# Patient Record
Sex: Male | Born: 1955 | Race: White | Hispanic: No | Marital: Married | Smoking: Current every day smoker
Health system: Southern US, Community
[De-identification: ages and names within clinical notes are randomized; demographics above are authoritative.]

## PROBLEM LIST (undated history)

## (undated) DIAGNOSIS — E785 Hyperlipidemia, unspecified: Secondary | ICD-10-CM

## (undated) DIAGNOSIS — M1990 Unspecified osteoarthritis, unspecified site: Secondary | ICD-10-CM

## (undated) DIAGNOSIS — R52 Pain, unspecified: Secondary | ICD-10-CM

## (undated) HISTORY — PX: CHOLECYSTECTOMY: SHX55

## (undated) HISTORY — PX: PROSTATE SURGERY: SHX751

## (undated) HISTORY — PX: TOTAL SHOULDER REPLACEMENT: SUR1217

---

## 2004-06-05 ENCOUNTER — Emergency Department: Payer: Self-pay | Admitting: Emergency Medicine

## 2004-09-20 ENCOUNTER — Emergency Department: Payer: Self-pay | Admitting: Emergency Medicine

## 2004-10-20 ENCOUNTER — Emergency Department: Payer: Self-pay | Admitting: Emergency Medicine

## 2005-10-03 ENCOUNTER — Emergency Department: Payer: Self-pay | Admitting: Emergency Medicine

## 2006-07-17 ENCOUNTER — Emergency Department: Payer: Self-pay | Admitting: Emergency Medicine

## 2006-09-26 ENCOUNTER — Emergency Department: Payer: Self-pay | Admitting: Emergency Medicine

## 2006-10-06 ENCOUNTER — Emergency Department: Payer: Self-pay | Admitting: Emergency Medicine

## 2007-01-09 ENCOUNTER — Encounter: Payer: Self-pay | Admitting: Internal Medicine

## 2007-01-09 ENCOUNTER — Ambulatory Visit: Payer: Self-pay | Admitting: Internal Medicine

## 2007-04-17 IMAGING — CR DG LUMBAR SPINE AP/LAT/OBLIQUES W/ FLEX AND EXT
1 series · 5 of 5 positions shown · non-contrast
Comparison: none

REASON FOR EXAM: back  pain  pt in rm 10
COMMENTS:

PROCEDURE:     DXR - DXR LUMBAR SPINE WITH OBLIQUES  - October 03, 2005  [DATE]
RESULT:     There is normal alignment and curvature. The vertebral body
heights and intervertebral disc spaces are intact. No spondylosis and/or
spondylolisthesis is noted.

[Series 1: view not recorded · 0.17mm/px · 5 of 5 slices shown]
[im 1/5]
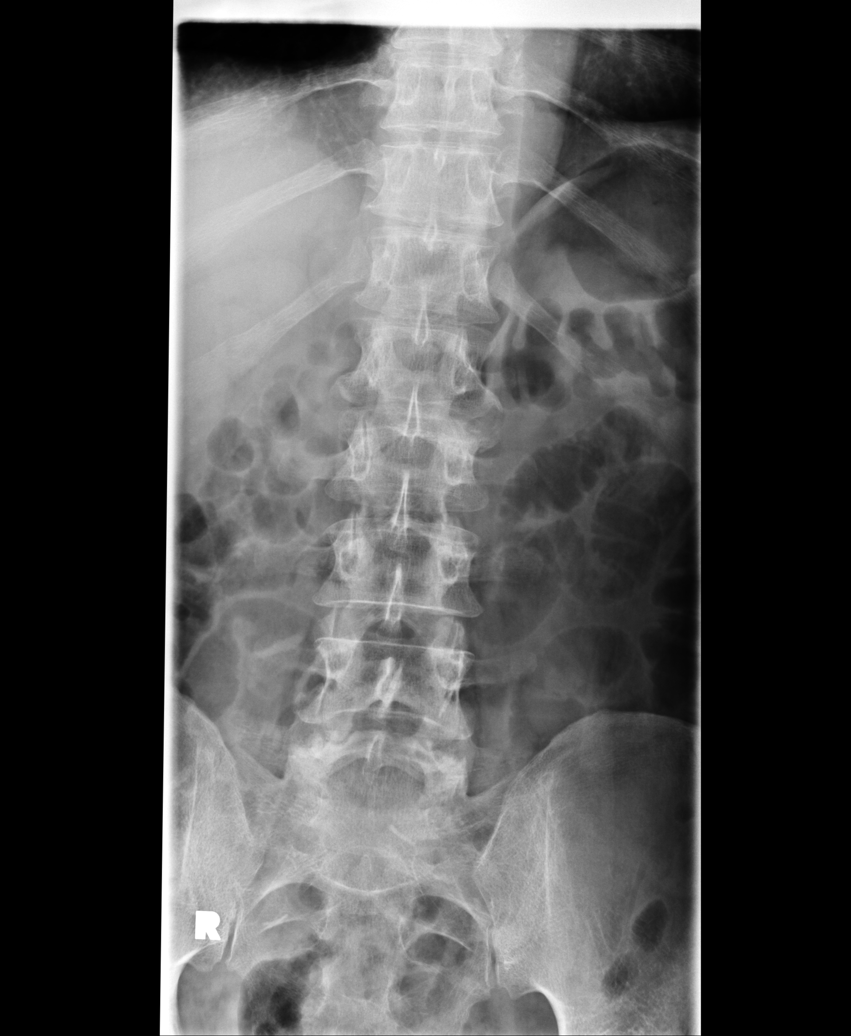
[im 2/5]
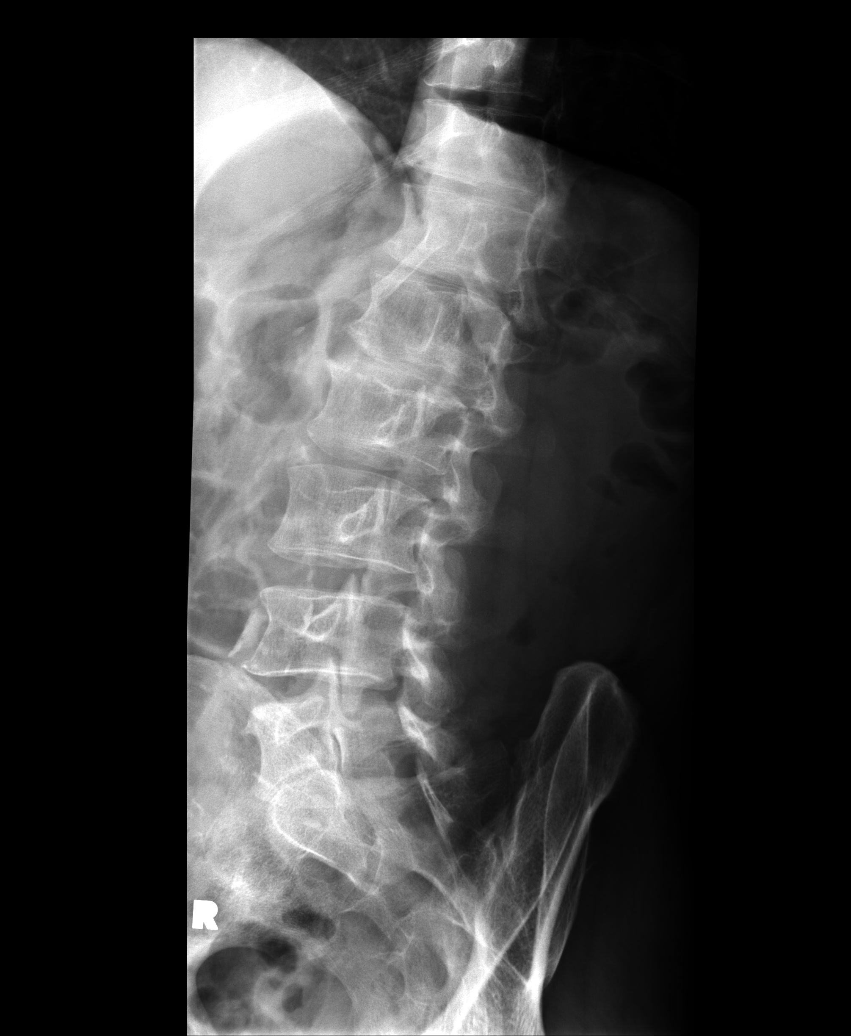
[im 3/5]
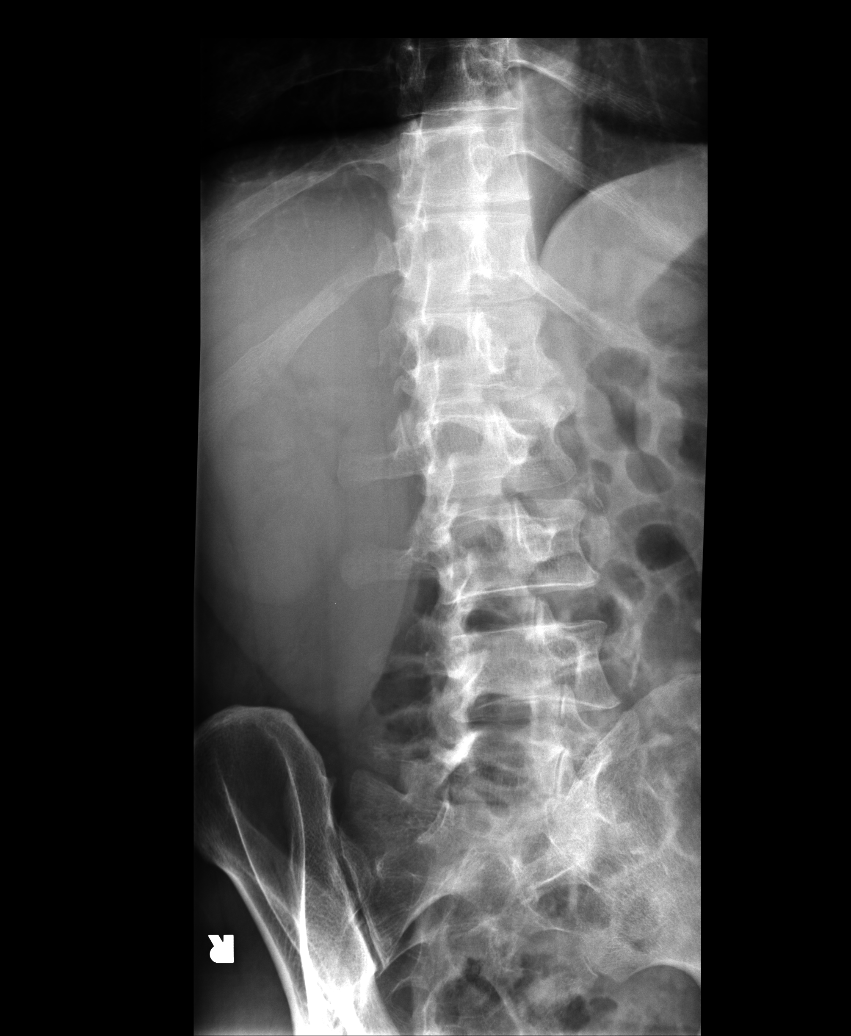
[im 4/5]
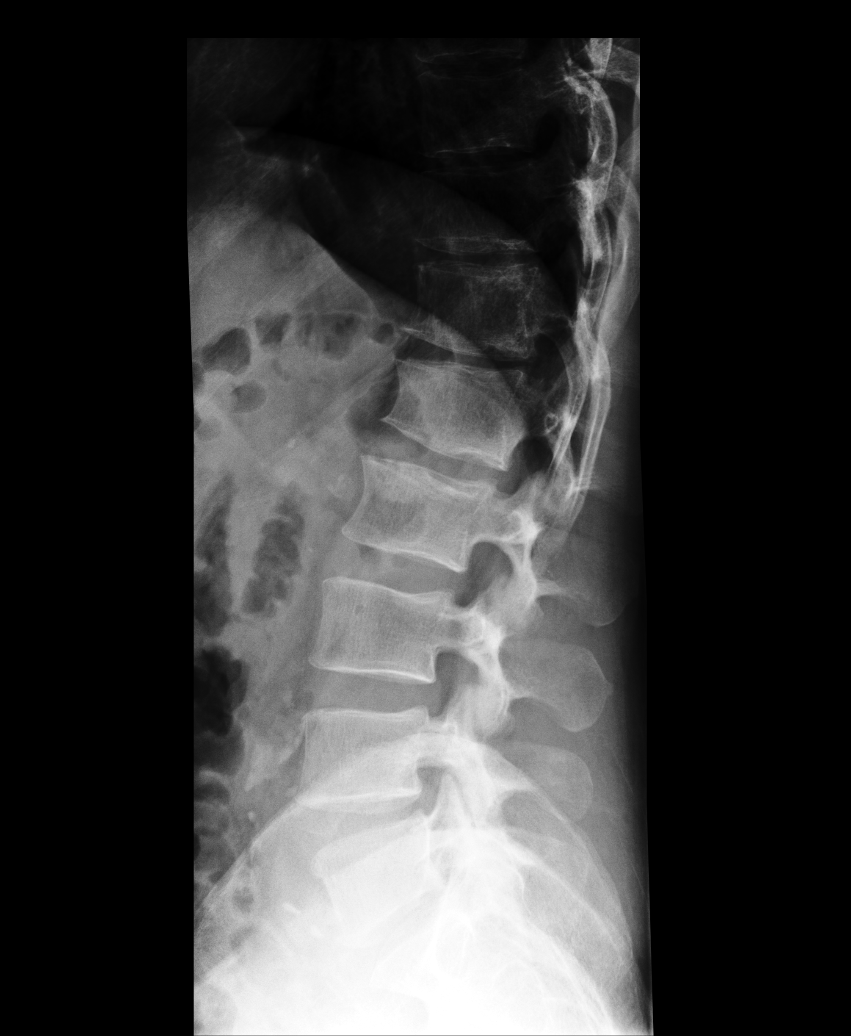
[im 5/5]
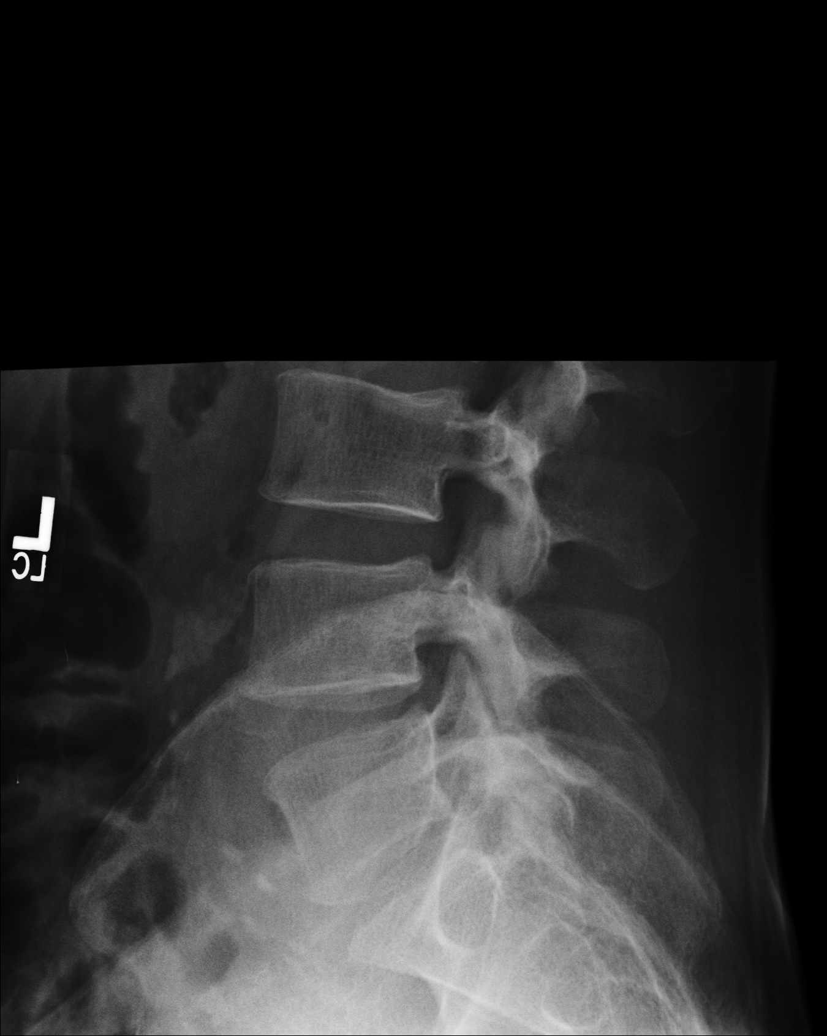

[5 of 5 positions shown; findings below may reference images not displayed]

IMPRESSION: 1)No significant bony abnormalities identified of the lumbar spine.

## 2008-04-09 IMAGING — CR RIGHT HAND - COMPLETE 3+ VIEW
1 series · 4 of 4 positions shown · non-contrast
Comparison: none

REASON FOR EXAM: fell
COMMENTS:   LMP: (Male)

[Series 1: view not recorded · 0.17mm/px · 4 of 4 slices shown]
[im 1/4]
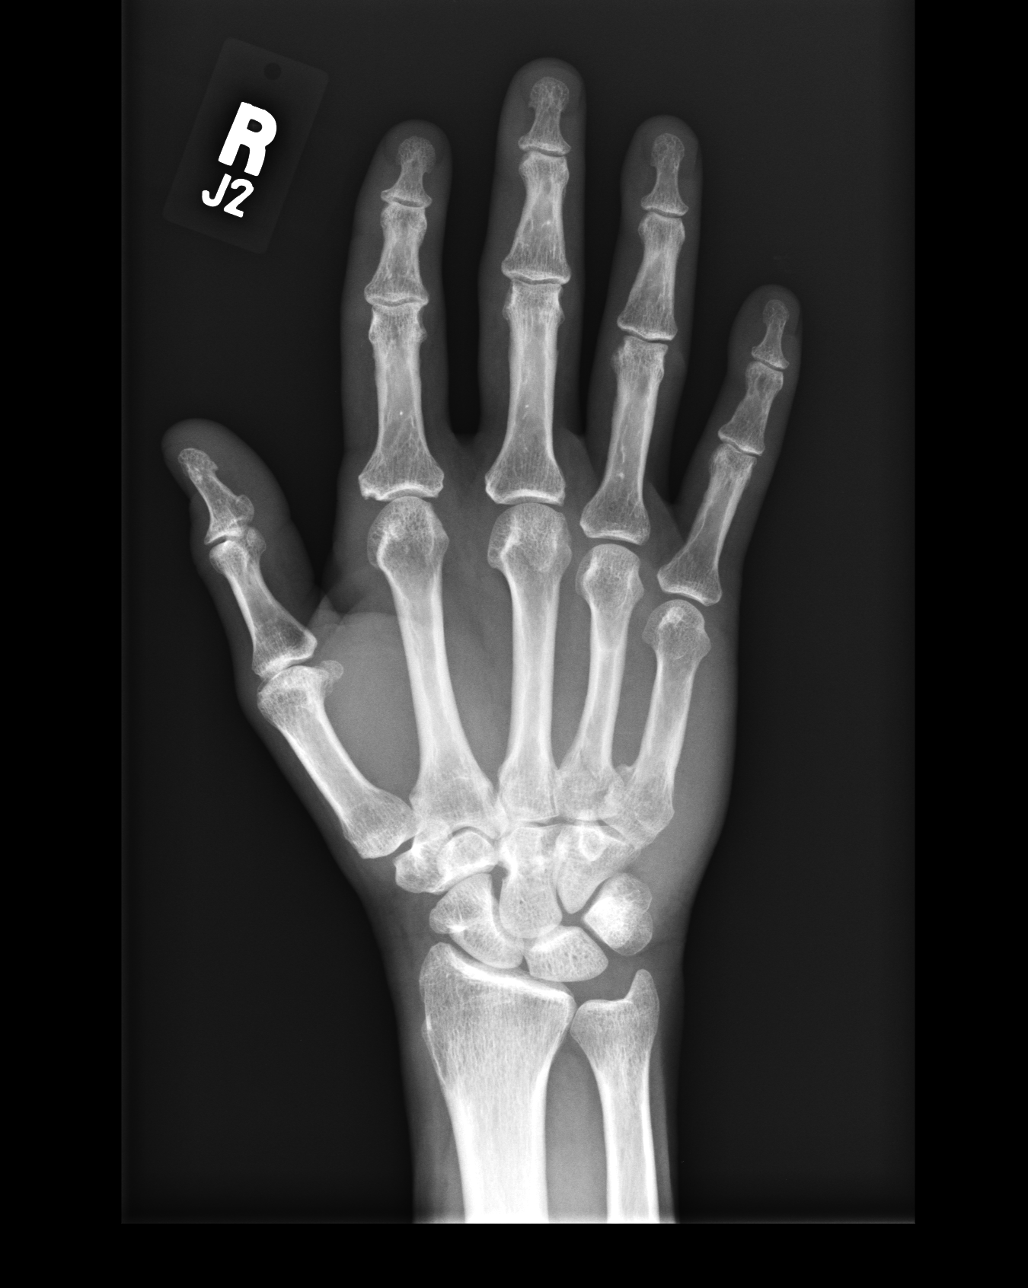
[im 2/4]
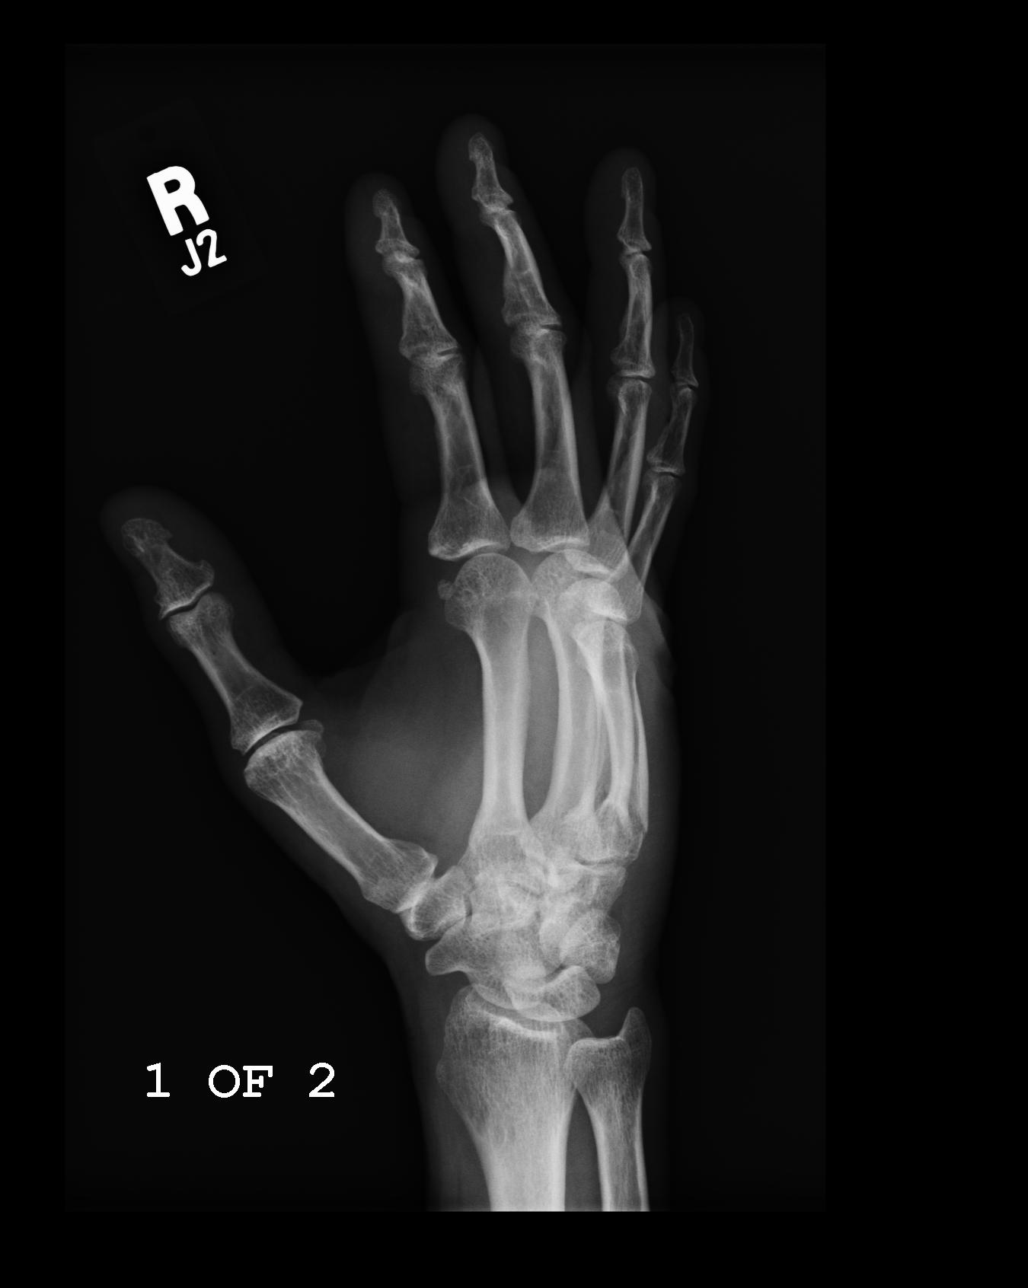
[im 3/4]
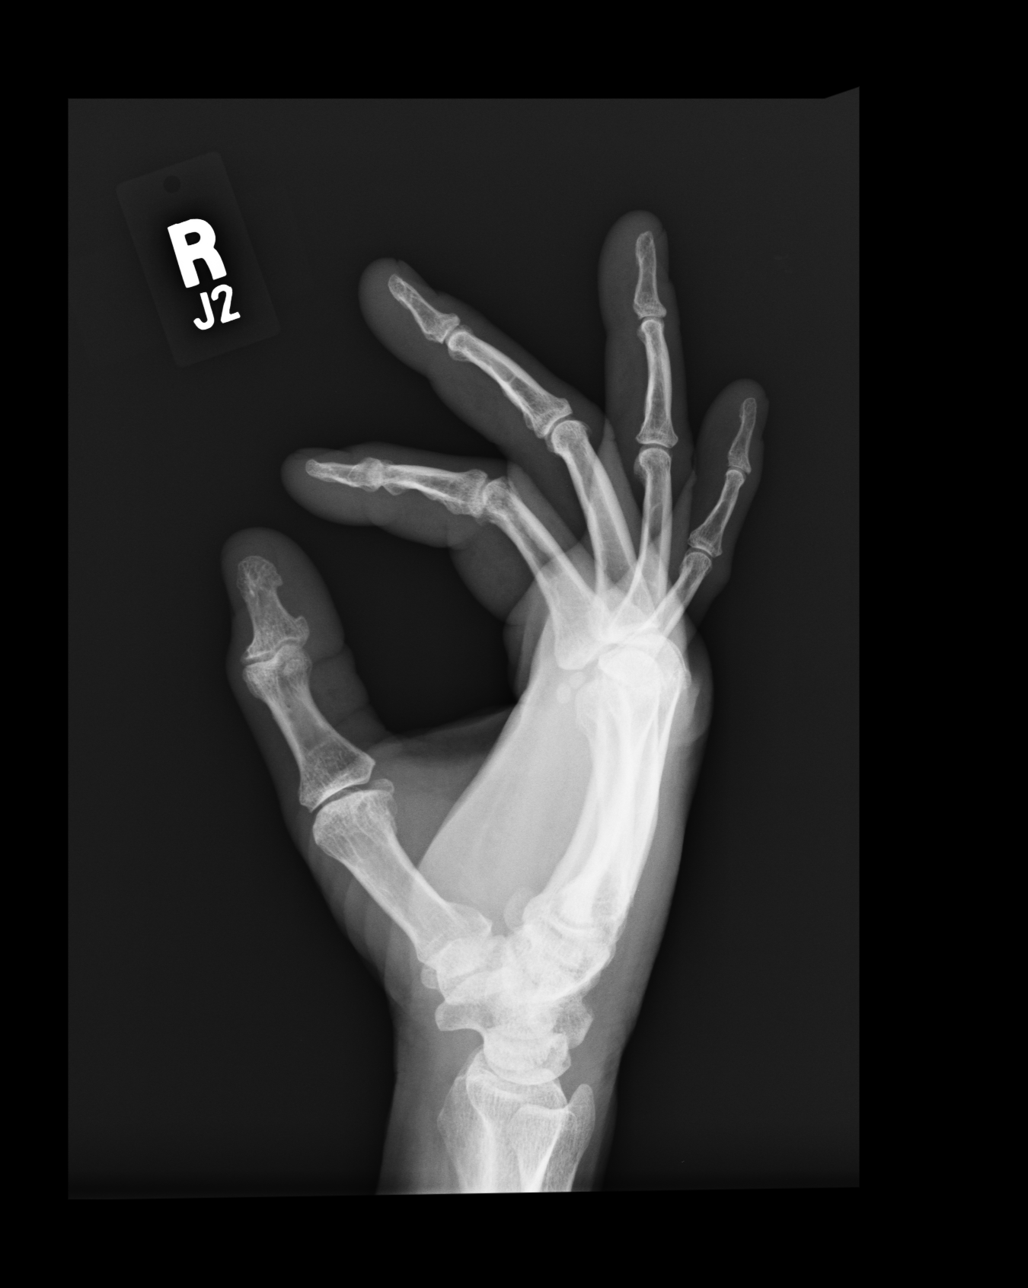
[im 4/4]
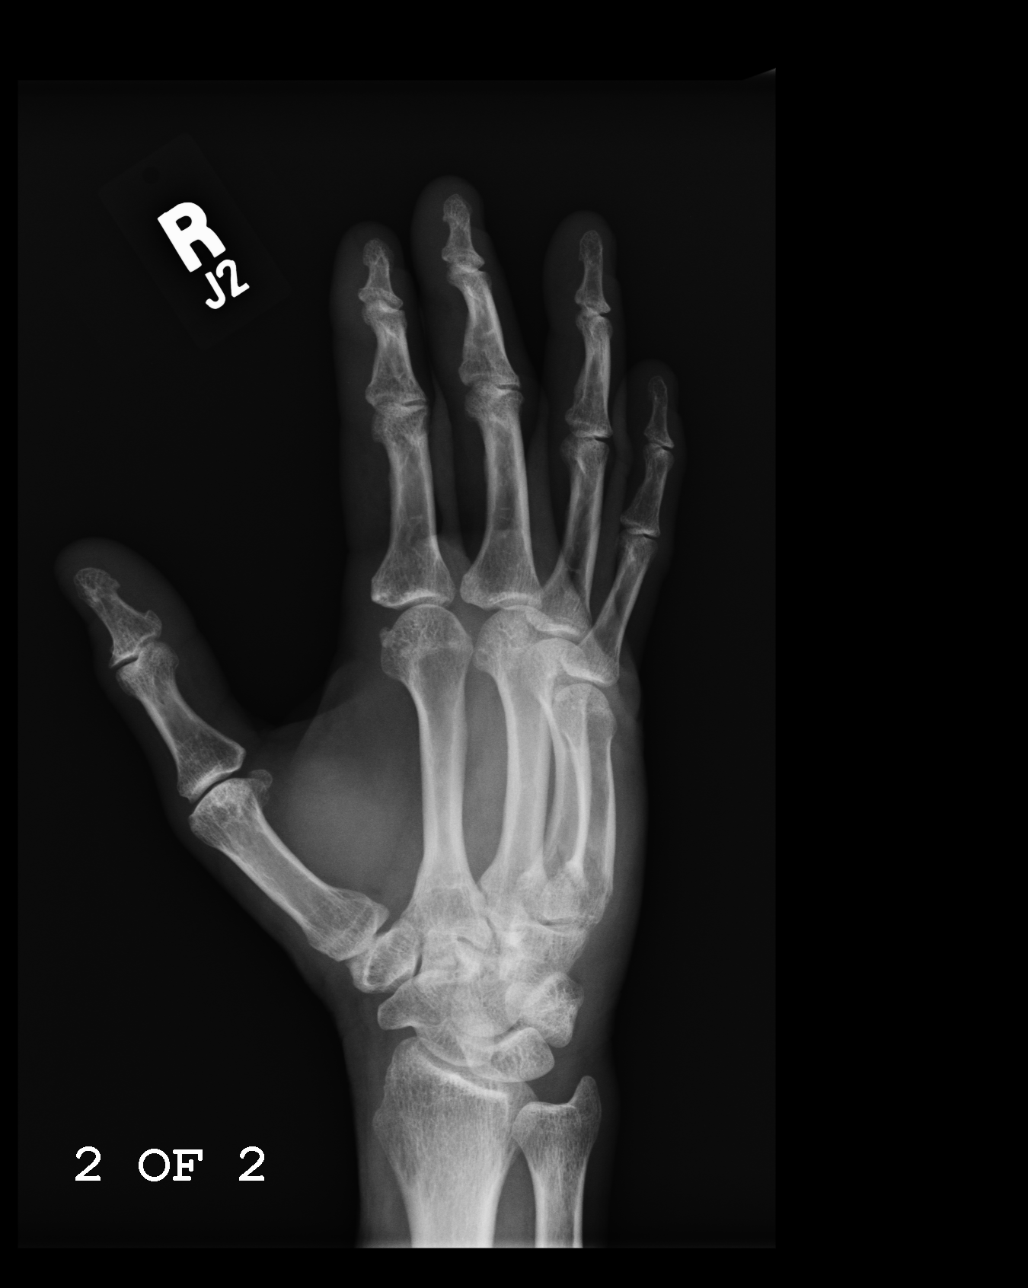

[4 of 4 positions shown; findings below may reference images not displayed]

PROCEDURE:     DXR - DXR HAND RT COMPLETE W/OBLIQUES  - September 26, 2006  [DATE]

RESULT:     Four views of the RIGHT hand were obtained. There are noted
minimally displaced fractures of the proximal portions of the RIGHT fourth
and fifth metacarpals. No other fractures are seen. There is noted mild
degenerative spurring at the DIP joints of the first through the fourth
fingers and at the IP joint of the thumb.
IMPRESSION: There are minimally displaced fractures of the proximal fourth and fifth
RIGHT metacarpals.

## 2008-05-14 ENCOUNTER — Emergency Department: Payer: Self-pay | Admitting: Emergency Medicine

## 2009-11-26 IMAGING — CR DG SHOULDER 3+V*R*
1 series · 3 of 3 positions shown · non-contrast
Comparison: none

REASON FOR EXAM: pain
COMMENTS:

PROCEDURE:     DXR - DXR SHOULDER RIGHT COMPLETE  - May 14, 2008  [DATE]
RESULT:     No fracture, dislocation or other acute bony abnormality is
identified. Incidental note is made of deformity of the RIGHT clavicle
compatible with the prior fracture that has now healed.

[Series 1: view not recorded · 0.17mm/px · 3 of 3 slices shown]
[im 1/3]
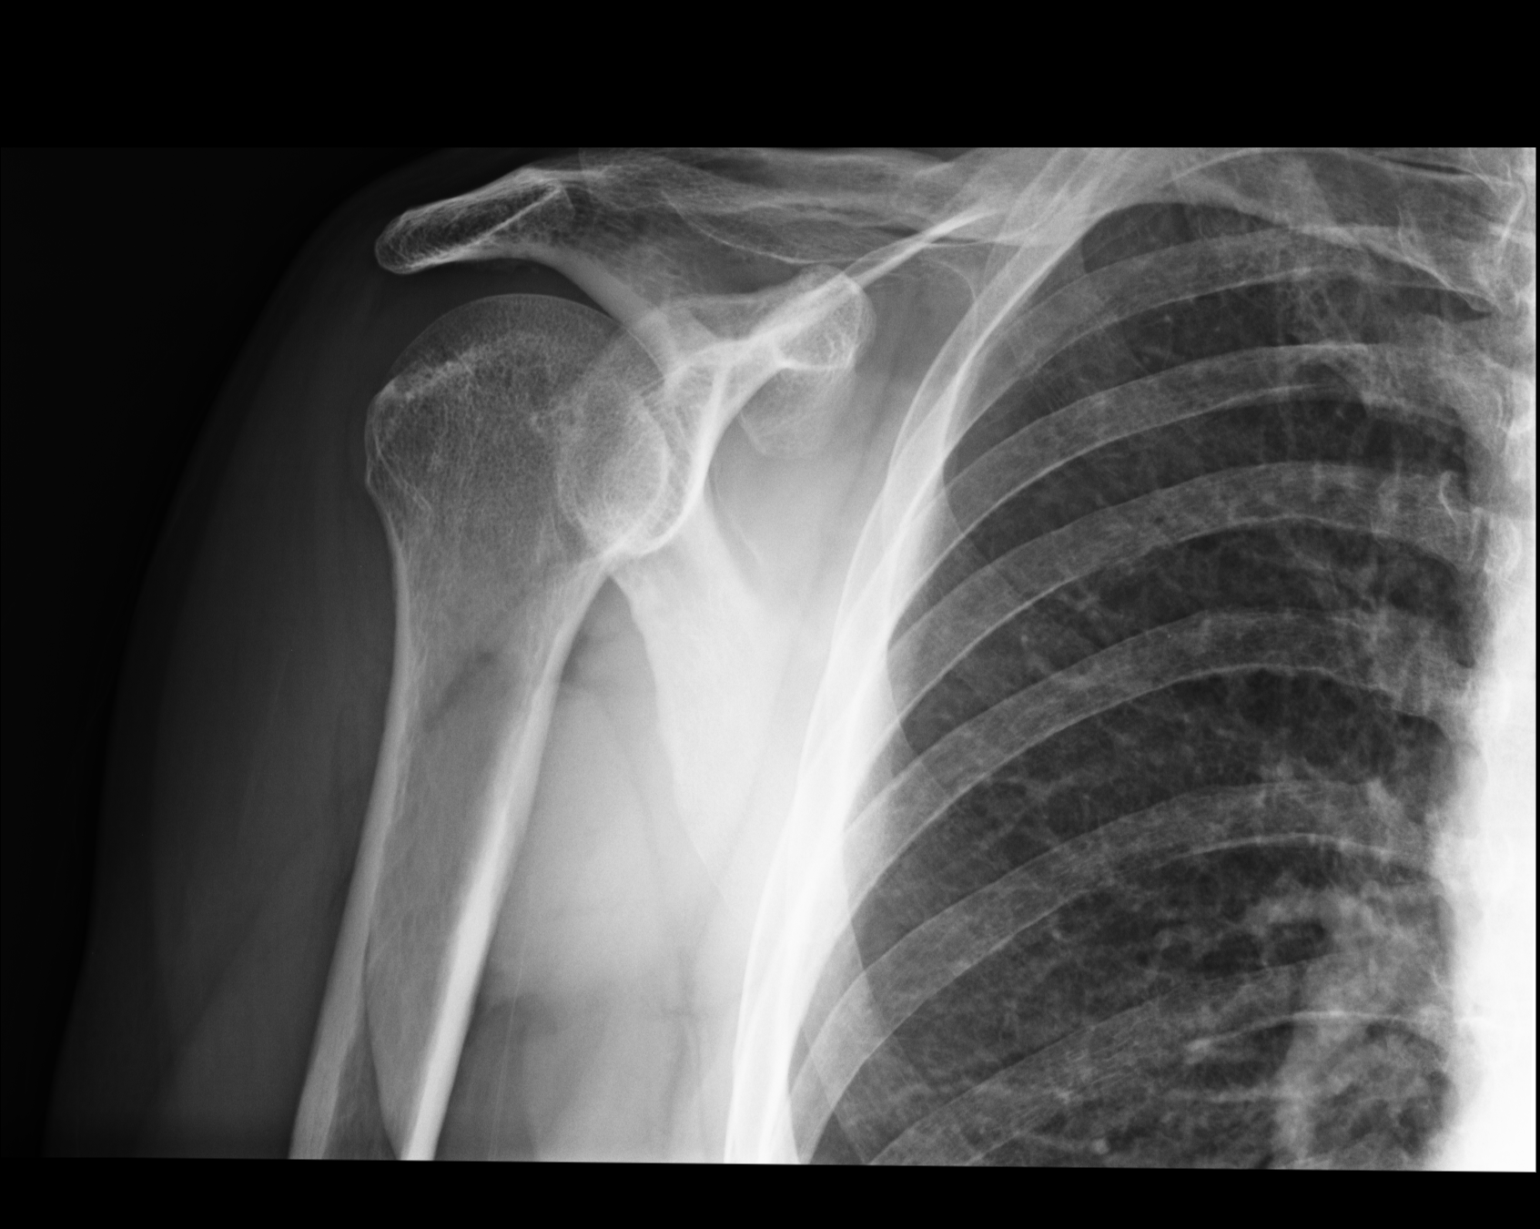
[im 2/3]
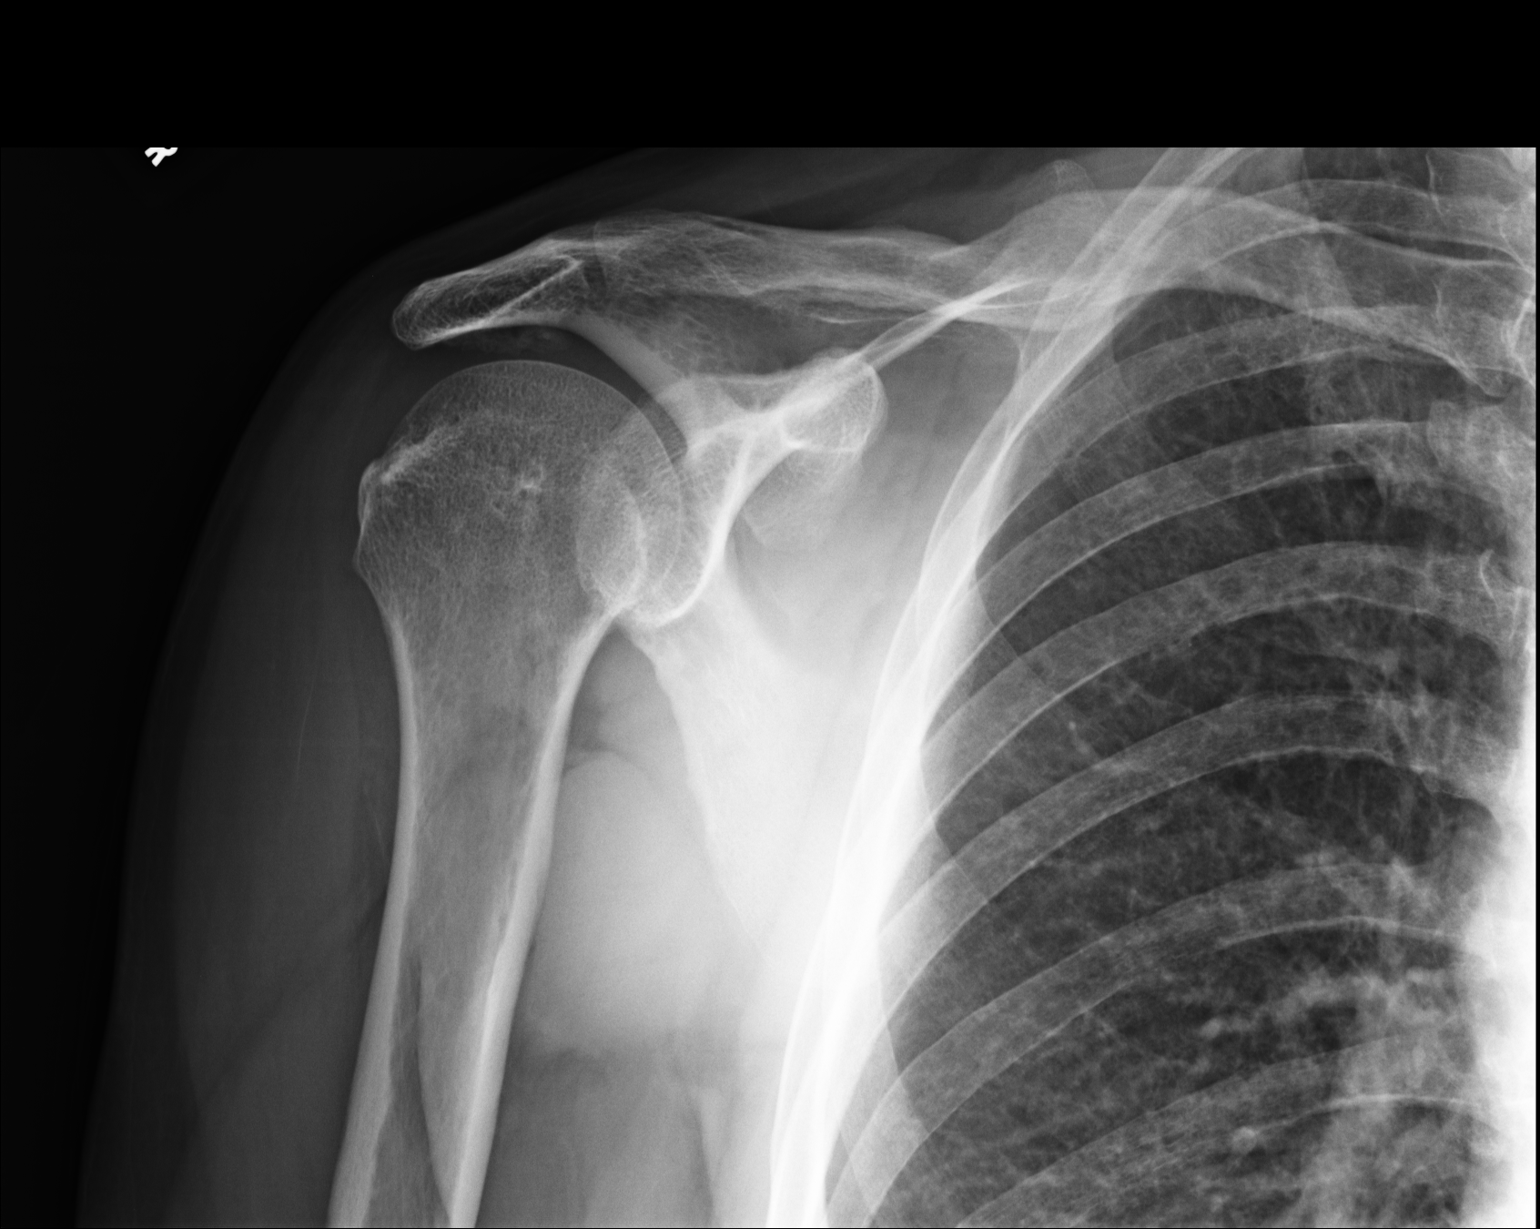
[im 3/3]
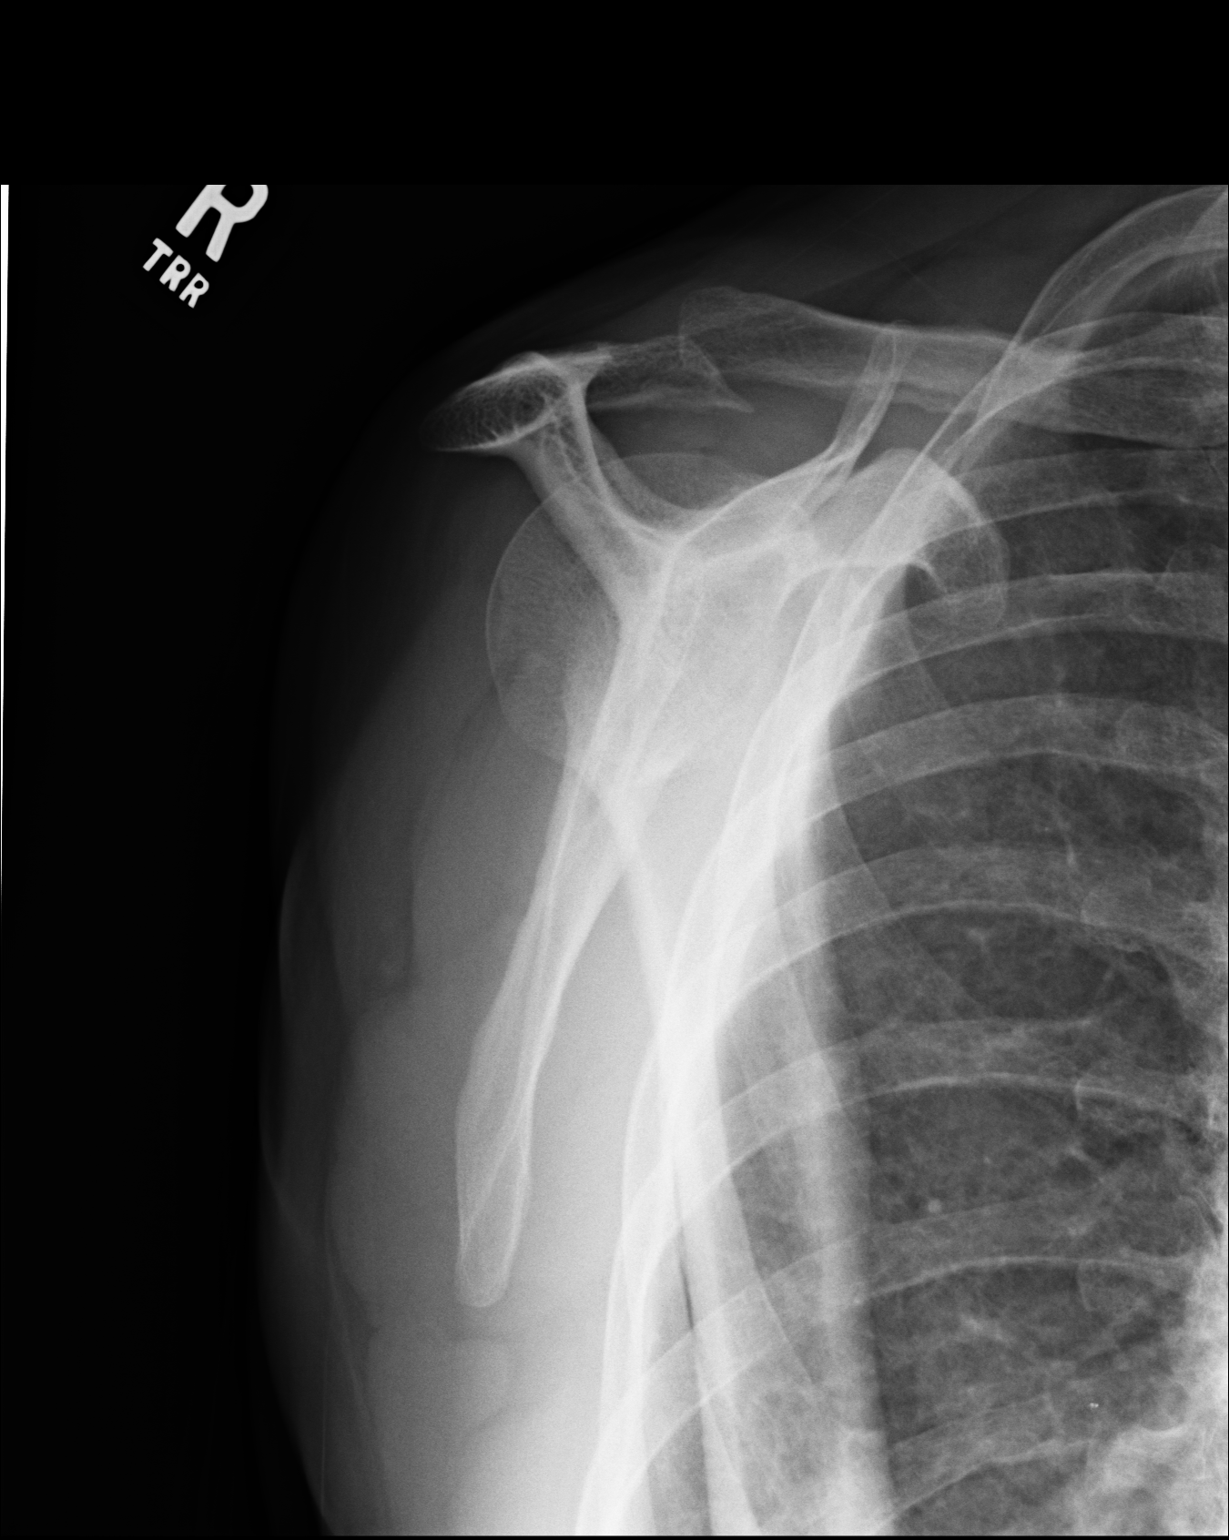

[3 of 3 positions shown; findings below may reference images not displayed]

IMPRESSION: No acute bony abnormalities are identified.

## 2009-12-28 ENCOUNTER — Ambulatory Visit: Payer: Self-pay | Admitting: Family Medicine

## 2011-03-08 ENCOUNTER — Ambulatory Visit: Payer: Self-pay | Admitting: Family Medicine

## 2012-09-19 IMAGING — US US PELVIS LIMITED
1 series · 17 of 25 positions shown · non-contrast
Comparison: None

REASON FOR EXAM: scrotal mass
COMMENTS:

PROCEDURE:     US  - US TESTICULAR  - March 08, 2011 [DATE]
RESULT:     Indication: Scrotal mass
TECHNIQUE: Multiple gray-scale, color-flow Doppler, and spectral waveform
tracings of the testicles and testicular vasculature are presented for
review.

[Series 1: us pelvis limited · 17 of 81 slices shown]
[im 1/81]
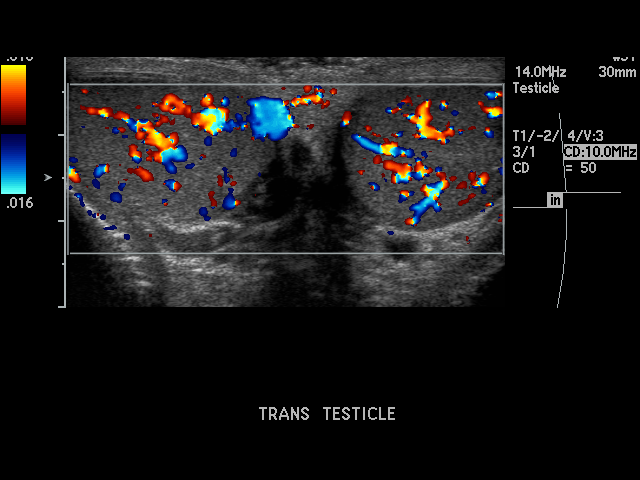
[im 7/81]
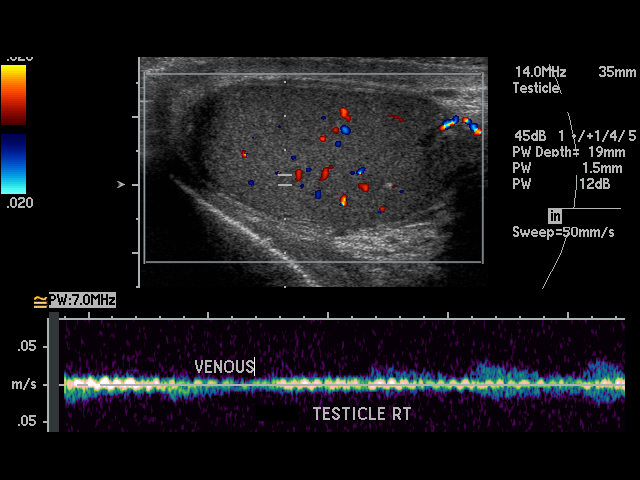
[im 11/81]
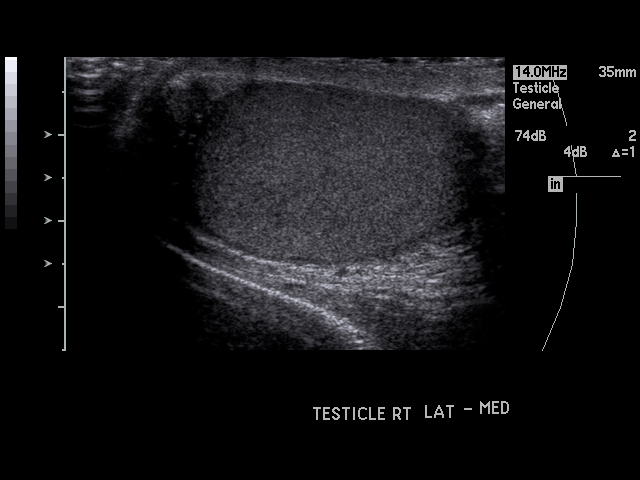
[im 17/81]
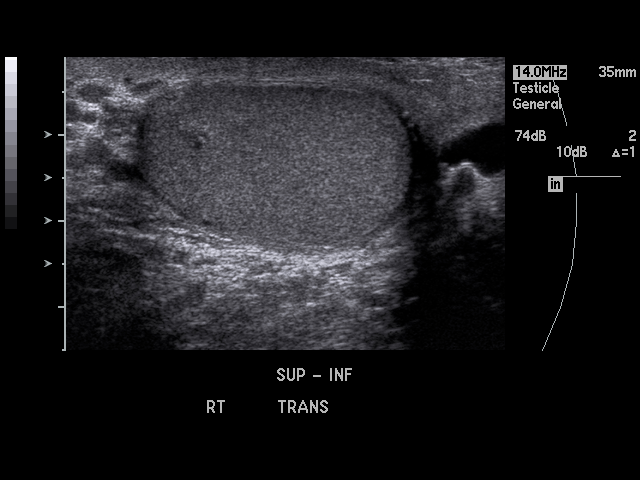
[im 21/81]
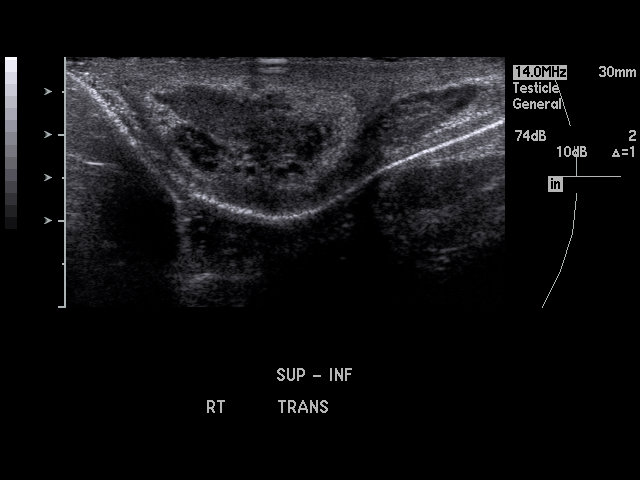
[im 27/81]
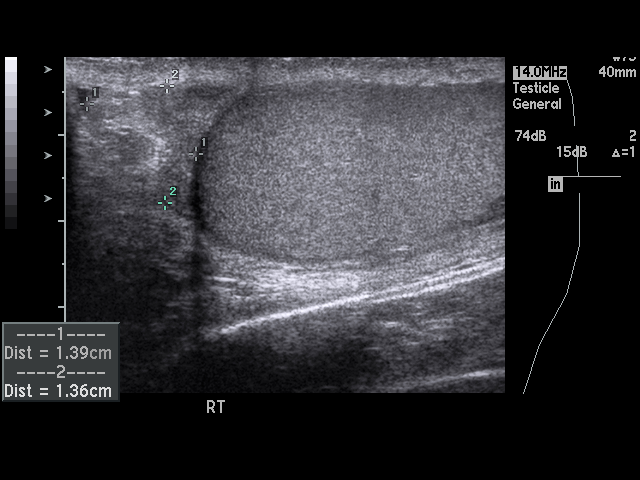
[im 31/81]
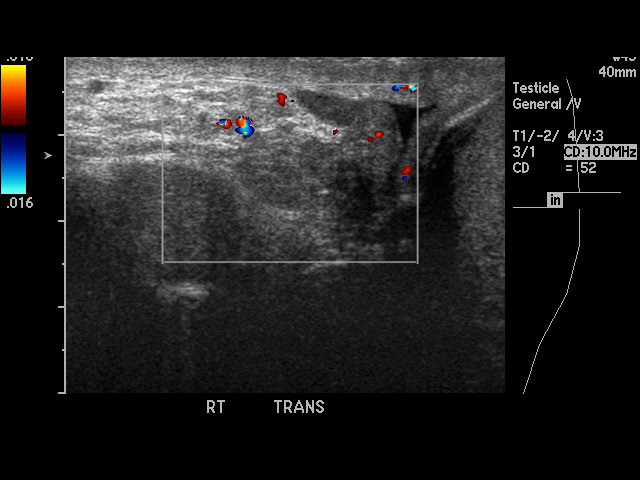
[im 37/81]
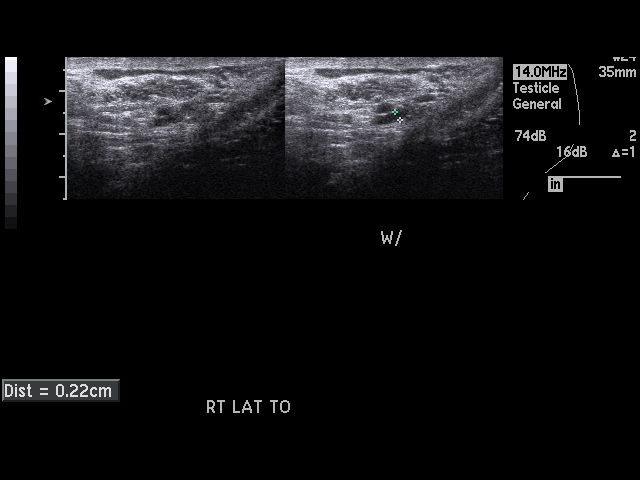
[im 41/81]
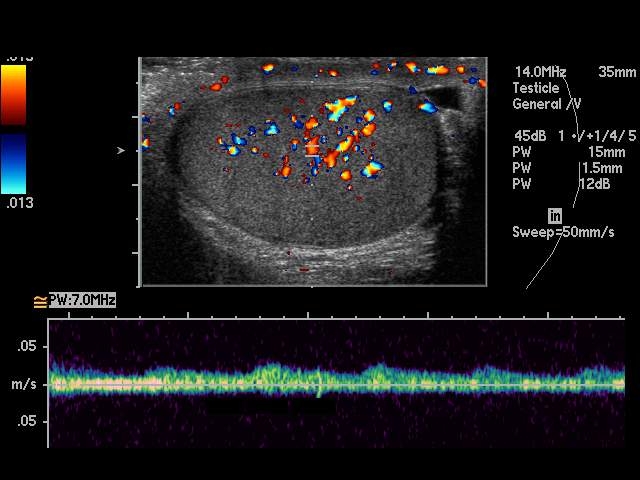
[im 44/81]
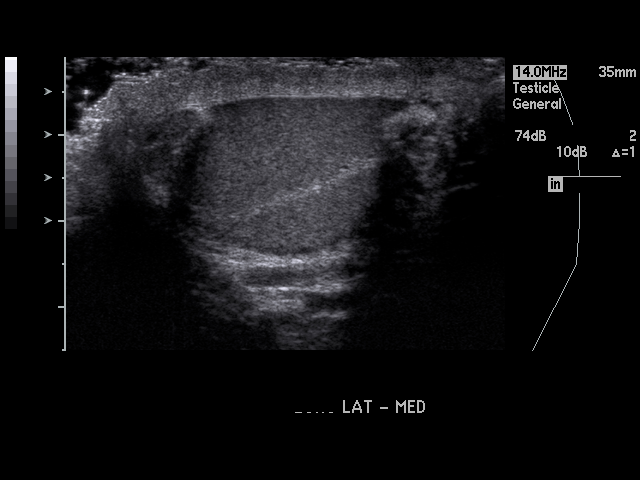
[im 51/81]
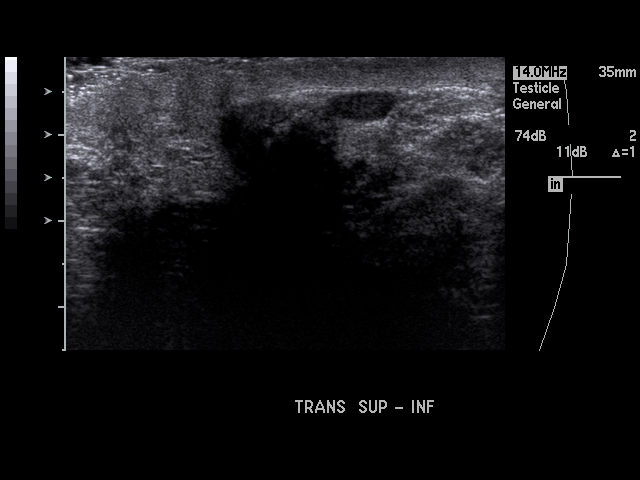
[im 54/81]
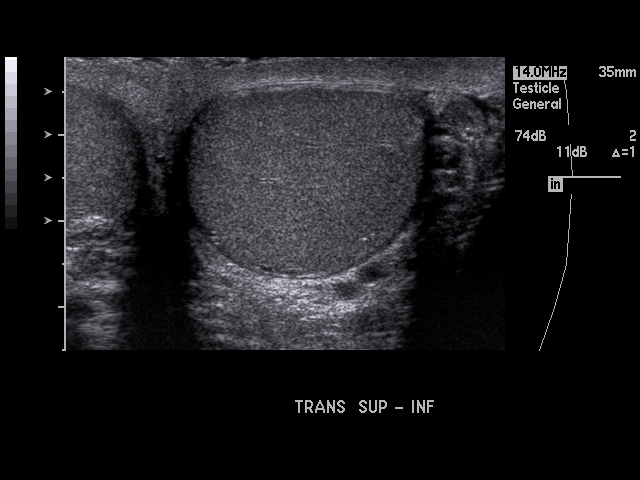
[im 61/81]
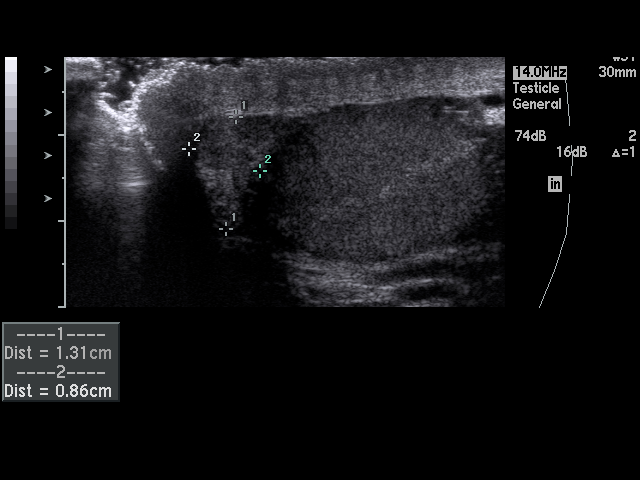
[im 64/81]
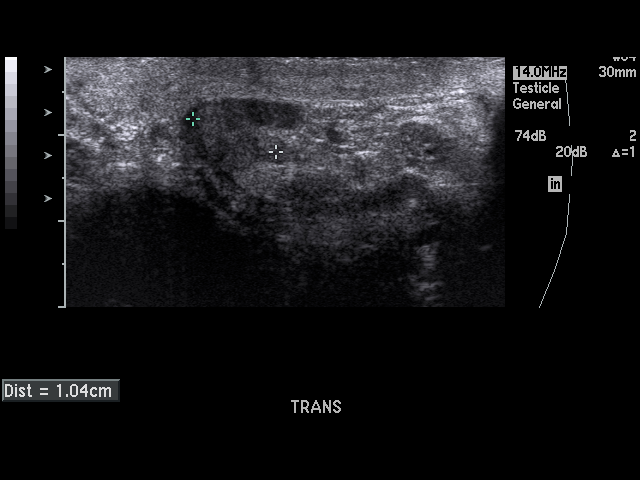
[im 71/81]
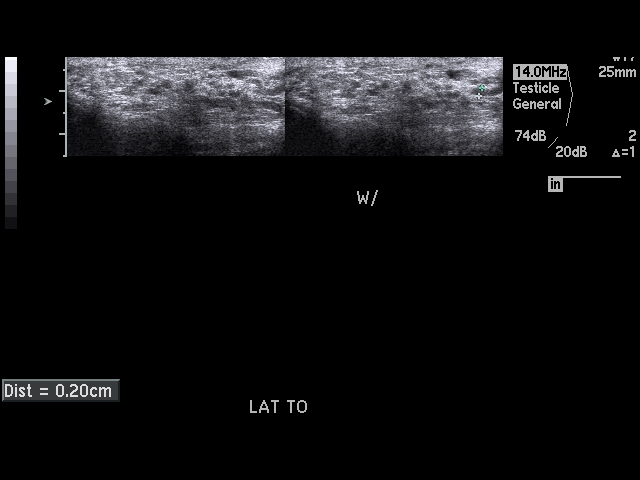
[im 74/81]
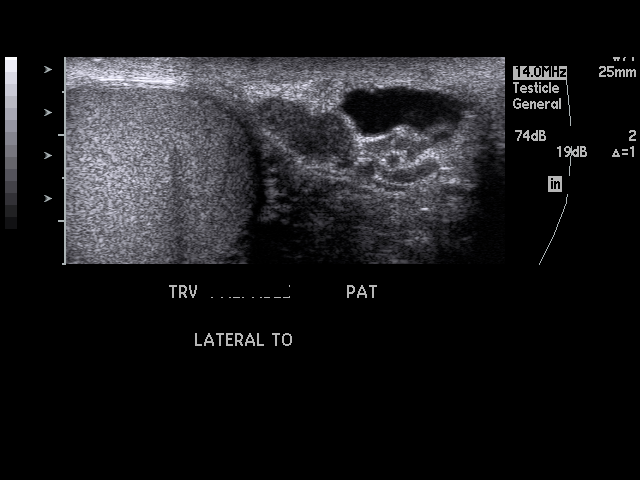
[im 81/81]
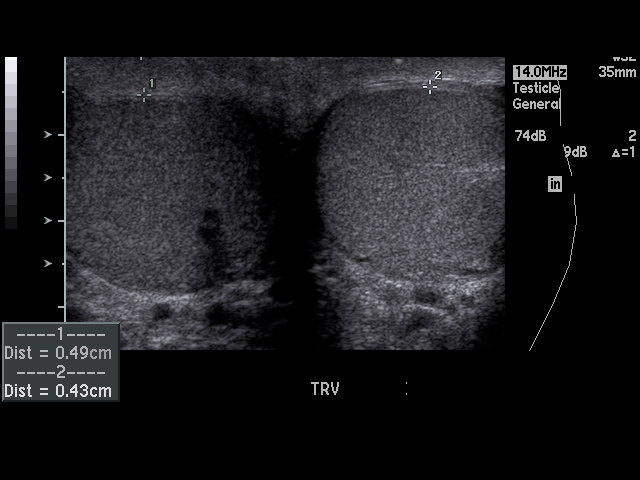

[17 of 25 positions shown; findings below may reference images not displayed]

FINDINGS: The right testicle is of normal echogenicity and measures 4.4 x 2.4 x 3 cm.
There is a relative hypoechoic area which shadowing in the right testicle
without internal Doppler flow which is of uncertain significance and may be
artifactual versus a small nodule. Recommend further evaluation with a
followup testicular ultrasound in 3 months. There is normal arterial and
venous blood flow. The right epididymal head measures 1.4 x 1.4 x 0.9 cm and
is normal in appearance. No right varicocele or significant hydrocele. There
are a few punctate hypoechoic foci within the right testicle likely
reflecting calcifications.

The left testicle is of normal echogenicity and measures 4.2 x 2.4 x 2.9 cm.
No focal mass lesions. There is normal arterial and venous blood flow.  The
left epididymal head measures 1.3 x 0.9 x 1 cm and is normal in appearance.
There is a small left hydrocele. There is no left varicocele.
IMPRESSION: 1.  There is a relative hypoechoic area which shadowing in the right
testicle without internal Doppler flow which is of uncertain significance
and may be artifactual versus a small nodule. Recommend further evaluation
with a followup testicular ultrasound in 3 months.

2. Small left hydrocele.

## 2013-04-03 ENCOUNTER — Emergency Department: Payer: Self-pay | Admitting: Emergency Medicine

## 2013-11-07 ENCOUNTER — Emergency Department: Payer: Self-pay | Admitting: Emergency Medicine

## 2013-11-16 ENCOUNTER — Emergency Department: Payer: Self-pay | Admitting: Emergency Medicine

## 2016-10-31 NOTE — Discharge Instructions (Signed)
[facility] °[facility] °ENDOSCOPIC SINUS SURGERY °[facility] ° °What is Functional Endoscopic Sinus Surgery? ° The Surgery involves making the natural openings of the sinuses larger by removing the bony partitions that separate the sinuses from the nasal cavity.  The natural sinus lining is preserved as much as possible to allow the sinuses to resume normal function after the surgery.  In some patients nasal polyps (excessively swollen lining of the sinuses) may be removed to relieve obstruction of the sinus openings.  The surgery is performed through the nose using lighted scopes, which eliminates the need for incisions on the face.  A septoplasty is a different procedure which is sometimes performed with sinus surgery.  It involves straightening the boy partition that separates the two sides of your nose.  A crooked or deviated septum may need repair if is obstructing the sinuses or nasal airflow.  Turbinate reduction is also often performed during sinus surgery.  The turbinates are bony proturberances from the side walls of the nose which swell and can obstruct the nose in patients with sinus and allergy problems.  Their size can be surgically reduced to help relieve nasal obstruction. ° °What Can Sinus Surgery Do For Me? ° Sinus surgery can reduce the frequency of sinus infections requiring antibiotic treatment.  This can provide improvement in nasal congestion, post-nasal drainage, facial pressure and nasal obstruction.  Surgery will NOT prevent you from ever having an infection again, so it usually only for patients who get infections 4 or more times yearly requiring antibiotics, or for infections that do not clear with antibiotics.  It will not cure nasal allergies, so patients with allergies may still require medication to treat their allergies after surgery. Surgery may improve headaches related to sinusitis, however, some people will continue to  require medication to control sinus headaches related to allergies.  Surgery will do nothing for other forms of headache (migraine, tension or cluster). ° °What Are the Risks of Endoscopic Sinus Surgery? ° Current techniques allow surgery to be performed safely with little risk, however, there are rare complications that patients should be aware of.  Because the sinuses are located around the eyes, there is risk of eye injury, including blindness, though again, this would be quite rare. This is usually a result of bleeding behind the eye during surgery, which puts the vision oat risk, though there are treatments to protect the vision and prevent permanent disrupted by surgery causing a leak of the spinal fluid that surrounds the brain.  More serious complications would include bleeding inside the brain cavity or damage to the brain.  Again, all of these complications are uncommon, and spinal fluid leaks can be safely managed surgically if they occur.  The most common complication of sinus surgery is bleeding from the nose, which may require packing or cauterization of the nose.  Continued sinus have polyps may experience recurrence of the polyps requiring revision surgery.  Alterations of sense of smell or injury to the tear ducts are also rare complications.  ° °What is the Surgery Like, and what is the Recovery? ° The Surgery usually takes a couple of hours to perform, and is usually performed under a general anesthetic (completely asleep).  Patients are usually discharged home after a couple of hours.  Sometimes during surgery it is necessary to pack the nose to control bleeding, and the packing is left in place for 24 - 48 hours, and removed by your surgeon.    If a septoplasty was performed during the procedure, there is often a splint placed which must be removed after 5-7 days.   °Discomfort: Pain is usually mild to moderate, and can be controlled by prescription pain medication or acetaminophen (Tylenol).   Aspirin, Ibuprofen (Advil, Motrin), or Naprosyn (Aleve) should be avoided, as they can cause increased bleeding.  Most patients feel sinus pressure like they have a bad head cold for several days.  Sleeping with your head elevated can help reduce swelling and facial pressure, as can ice packs over the face.  A humidifier may be helpful to keep the mucous and blood from drying in the nose.  ° °Diet: There are no specific diet restrictions, however, you should generally start with clear liquids and a light diet of bland foods because the anesthetic can cause some nausea.  Advance your diet depending on how your stomach feels.  Taking your pain medication with food will often help reduce stomach upset which pain medications can cause. ° °Nasal Saline Irrigation: It is important to remove blood clots and dried mucous from the nose as it is healing.  This is done by having you irrigate the nose at least 3 - 4 times daily with a salt water solution.  We recommend using NeilMed Sinus Rinse (available at the drug store).  Fill the squeeze bottle with the solution, bend over a sink, and insert the tip of the squeeze bottle into the nose ½ of an inch.  Point the tip of the squeeze bottle towards the inside corner of the eye on the same side your irrigating.  Squeeze the bottle and gently irrigate the nose.  If you bend forward as you do this, most of the fluid will flow back out of the nose, instead of down your throat.   The solution should be warm, near body temperature, when you irrigate.   Each time you irrigate, you should use a full squeeze bottle.  ° °Note that if you are instructed to use Nasal Steroid Sprays at any time after your surgery, irrigate with saline BEFORE using the steroid spray, so you do not wash it all out of the nose. °Another product, Nasal Saline Gel (such as AYR Nasal Saline Gel) can be applied in each nostril 3 - 4 times daily to moisture the nose and reduce scabbing or crusting. ° °Bleeding:   Bloody drainage from the nose can be expected for several days, and patients are instructed to irrigate their nose frequently with salt water to help remove mucous and blood clots.  The drainage may be dark red or brown, though some fresh blood may be seen intermittently, especially after irrigation.  Do not blow you nose, as bleeding may occur. If you must sneeze, keep your mouth open to allow air to escape through your mouth. ° °If heavy bleeding occurs: Irrigate the nose with saline to rinse out clots, then spray the nose 3 - 4 times with Afrin Nasal Decongestant Spray.  The spray will constrict the blood vessels to slow bleeding.  Pinch the lower half of your nose shut to apply pressure, and lay down with your head elevated.  Ice packs over the nose may help as well. If bleeding persists despite these measures, you should notify your doctor.  Do not use the Afrin routinely to control nasal congestion after surgery, as it can result in worsening congestion and may affect healing.  ° °Activity: Return to work varies among patients. Most patients will be out of   work at least 5 - 7 days to recover.  Patient may return to work after they are off of narcotic pain medication, and feeling well enough to perform the functions of their job.  Patients must avoid heavy lifting (over 10 pounds) or strenuous physical for 2 weeks after surgery, so your employer may need to assign you to light duty, or keep you out of work longer if light duty is not possible.  NOTE: you should not drive, operate dangerous machinery, do any mentally demanding tasks or make any important legal or financial decisions while on narcotic pain medication and recovering from the general anesthetic.  °  °Call Your Doctor Immediately if You Have Any of the Following: °1. Bleeding that you cannot control with the above measures °2. Loss of vision, double vision, bulging of the eye or black eyes. °3. Fever over 101 degrees °4. Neck stiffness with severe  headache, fever, nausea and change in mental state. °You are always encourage to call anytime with concerns, however, please call with requests for pain medication refills during office hours. ° °Office Endoscopy: During follow-up visits your doctor will remove any packing or splints that may have been placed and evaluate and clean your sinuses endoscopically.  Topical anesthetic will be used to make this as comfortable as possible, though you may want to take your pain medication prior to the visit.  How often this will need to be done varies from patient to patient.  After complete recovery from the surgery, you may need follow-up endoscopy from time to time, particularly if there is concern of recurrent infection or nasal polyps. ° ° °General Anesthesia, Adult, Care After °These instructions provide you with information about caring for yourself after your procedure. Your health care provider may also give you more specific instructions. Your treatment has been planned according to current medical practices, but problems sometimes occur. Call your health care provider if you have any problems or questions after your procedure. °What can I expect after the procedure? °After the procedure, it is common to have: °· Vomiting. °· A sore throat. °· Mental slowness. °It is common to feel: °· Nauseous. °· Cold or shivery. °· Sleepy. °· Tired. °· Sore or achy, even in parts of your body where you did not have surgery. °Follow these instructions at home: °For at least 24 hours after the procedure: °· Do not: °¨ Participate in activities where you could fall or become injured. °¨ Drive. °¨ Use heavy machinery. °¨ Drink alcohol. °¨ Take sleeping pills or medicines that cause drowsiness. °¨ Make important decisions or sign legal documents. °¨ Take care of children on your own. °· Rest. °Eating and drinking °· If you vomit, drink water, juice, or soup when you can drink without vomiting. °· Drink enough fluid to keep your  urine clear or pale yellow. °· Make sure you have little or no nausea before eating solid foods. °· Follow the diet recommended by your health care provider. °General instructions °· Have a responsible adult stay with you until you are awake and alert. °· Return to your normal activities as told by your health care provider. Ask your health care provider what activities are safe for you. °· Take over-the-counter and prescription medicines only as told by your health care provider. °· If you smoke, do not smoke without supervision. °· Keep all follow-up visits as told by your health care provider. This is important. °Contact a health care provider if: °· You continue to have nausea   or vomiting at home, and medicines are not helpful. °· You cannot drink fluids or start eating again. °· You cannot urinate after 8-12 hours. °· You develop a skin rash. °· You have fever. °· You have increasing redness at the site of your procedure. °Get help right away if: °· You have difficulty breathing. °· You have chest pain. °· You have unexpected bleeding. °· You feel that you are having a life-threatening or urgent problem. °This information is not intended to replace advice given to you by your health care provider. Make sure you discuss any questions you have with your health care provider. °Document Released: 09/25/2000 Document Revised: 11/22/2015 Document Reviewed: 06/03/2015 °Elsevier Interactive Patient Education © 2017 [employer] ° °

## 2016-11-02 ENCOUNTER — Ambulatory Visit: Payer: Medicare Other | Admitting: Anesthesiology

## 2016-11-02 ENCOUNTER — Encounter
Admission: RE | Disposition: A | Discharge status: Home / Self Care | Payer: Self-pay | Source: Ambulatory Visit | Attending: Otolaryngology

## 2016-11-02 ENCOUNTER — Ambulatory Visit
Admission: RE | Admit: 2016-11-02 | Discharge: 2016-11-02 | Disposition: A | Discharge status: Home / Self Care | Payer: Medicare Other | Source: Ambulatory Visit | Attending: Otolaryngology | Admitting: Otolaryngology

## 2016-11-02 DIAGNOSIS — J321 Chronic frontal sinusitis: Secondary | ICD-10-CM | POA: Diagnosis not present

## 2016-11-02 DIAGNOSIS — F17200 Nicotine dependence, unspecified, uncomplicated: Secondary | ICD-10-CM | POA: Diagnosis not present

## 2016-11-02 DIAGNOSIS — Z79899 Other long term (current) drug therapy: Secondary | ICD-10-CM | POA: Diagnosis not present

## 2016-11-02 DIAGNOSIS — J342 Deviated nasal septum: Secondary | ICD-10-CM | POA: Insufficient documentation

## 2016-11-02 DIAGNOSIS — J320 Chronic maxillary sinusitis: Secondary | ICD-10-CM | POA: Insufficient documentation

## 2016-11-02 DIAGNOSIS — J322 Chronic ethmoidal sinusitis: Secondary | ICD-10-CM | POA: Insufficient documentation

## 2016-11-02 DIAGNOSIS — J449 Chronic obstructive pulmonary disease, unspecified: Secondary | ICD-10-CM | POA: Diagnosis not present

## 2016-11-02 HISTORY — DX: Unspecified osteoarthritis, unspecified site: M19.90

## 2016-11-02 HISTORY — PX: IMAGE GUIDED SINUS SURGERY: SHX6570

## 2016-11-02 HISTORY — DX: Pain, unspecified: R52

## 2016-11-02 HISTORY — DX: Hyperlipidemia, unspecified: E78.5

## 2016-11-02 HISTORY — PX: ETHMOIDECTOMY: SHX5197

## 2016-11-02 HISTORY — PX: MAXILLARY ANTROSTOMY: SHX2003

## 2016-11-02 HISTORY — PX: FRONTAL SINUS EXPLORATION: SHX6591

## 2016-11-02 HISTORY — PX: SEPTOPLASTY: SHX2393

## 2016-11-02 SURGERY — SINUS SURGERY, WITH IMAGING GUIDANCE
Anesthesia: General | Site: Nose | Laterality: Right | Wound class: Clean Contaminated

## 2016-11-02 MED ORDER — OXYCODONE HCL 5 MG PO TABS
5.0000 mg | ORAL_TABLET | Freq: Once | ORAL | Status: AC | PRN
  Administered 2016-11-02: 5 mg via ORAL

## 2016-11-02 MED ORDER — LIDOCAINE HCL (CARDIAC) 20 MG/ML IV SOLN
INTRAVENOUS | Status: DC | PRN
  Administered 2016-11-02: 50 mg via INTRAVENOUS

## 2016-11-02 MED ORDER — LIDOCAINE-EPINEPHRINE 1 %-1:100000 IJ SOLN
INTRAMUSCULAR | Status: DC | PRN
  Administered 2016-11-02: 6 mL

## 2016-11-02 MED ORDER — PHENYLEPHRINE HCL 0.5 % NA SOLN
NASAL | Status: DC | PRN
  Administered 2016-11-02: 30 mL via TOPICAL

## 2016-11-02 MED ORDER — ONDANSETRON HCL 4 MG/2ML IJ SOLN: 4.0000 mg | Freq: Once | INTRAMUSCULAR | Status: DC | PRN

## 2016-11-02 MED ORDER — FENTANYL CITRATE (PF) 100 MCG/2ML IJ SOLN: 25.0000 ug | INTRAMUSCULAR | Status: DC | PRN

## 2016-11-02 MED ORDER — CEFAZOLIN SODIUM-DEXTROSE 2-4 GM/100ML-% IV SOLN: 2.0000 g | Freq: Once | INTRAVENOUS | Status: DC

## 2016-11-02 MED ORDER — DEXAMETHASONE SODIUM PHOSPHATE 4 MG/ML IJ SOLN
INTRAMUSCULAR | Status: DC | PRN
  Administered 2016-11-02: 10 mg via INTRAVENOUS

## 2016-11-02 MED ORDER — ROCURONIUM BROMIDE 100 MG/10ML IV SOLN
INTRAVENOUS | Status: DC | PRN
  Administered 2016-11-02: 10 mg via INTRAVENOUS
  Administered 2016-11-02: 25 mg via INTRAVENOUS

## 2016-11-02 MED ORDER — DEXTROSE 5 % IV SOLN
600.0000 mg | Freq: Once | INTRAVENOUS | Status: AC
  Administered 2016-11-02: 600 mg via INTRAVENOUS

## 2016-11-02 MED ORDER — SCOPOLAMINE 1 MG/3DAYS TD PT72
1.0000 | MEDICATED_PATCH | Freq: Once | TRANSDERMAL | Status: DC
  Administered 2016-11-02: 1.5 mg via TRANSDERMAL

## 2016-11-02 MED ORDER — EPHEDRINE SULFATE 50 MG/ML IJ SOLN
INTRAMUSCULAR | Status: DC | PRN
  Administered 2016-11-02 (×4): 5 mg via INTRAVENOUS
  Administered 2016-11-02: 10 mg via INTRAVENOUS
  Administered 2016-11-02: 5 mg via INTRAVENOUS
  Administered 2016-11-02: 10 mg via INTRAVENOUS

## 2016-11-02 MED ORDER — GLYCOPYRROLATE 0.2 MG/ML IJ SOLN
INTRAMUSCULAR | Status: DC | PRN
  Administered 2016-11-02: .1 mg via INTRAVENOUS

## 2016-11-02 MED ORDER — MIDAZOLAM HCL 5 MG/5ML IJ SOLN
INTRAMUSCULAR | Status: DC | PRN
  Administered 2016-11-02: 2 mg via INTRAVENOUS

## 2016-11-02 MED ORDER — OXYCODONE HCL 5 MG/5ML PO SOLN: 5.0000 mg | Freq: Once | ORAL | Status: AC | PRN

## 2016-11-02 MED ORDER — OXYMETAZOLINE HCL 0.05 % NA SOLN
2.0000 | Freq: Once | NASAL | Status: AC
  Administered 2016-11-02: 2 via NASAL

## 2016-11-02 MED ORDER — PROPOFOL 10 MG/ML IV BOLUS
INTRAVENOUS | Status: DC | PRN
  Administered 2016-11-02: 150 mg via INTRAVENOUS
  Administered 2016-11-02: 50 mg via INTRAVENOUS

## 2016-11-02 MED ORDER — FENTANYL CITRATE (PF) 100 MCG/2ML IJ SOLN
INTRAMUSCULAR | Status: DC | PRN
  Administered 2016-11-02: 100 ug via INTRAVENOUS

## 2016-11-02 MED ORDER — ACETAMINOPHEN 10 MG/ML IV SOLN
1000.0000 mg | Freq: Once | INTRAVENOUS | Status: AC
  Administered 2016-11-02: 1000 mg via INTRAVENOUS

## 2016-11-02 MED ORDER — LACTATED RINGERS IV SOLN
INTRAVENOUS | Status: DC
  Administered 2016-11-02 (×2): via INTRAVENOUS

## 2016-11-02 SURGICAL SUPPLY — 35 items
BATTERY INSTRU NAVIGATION (MISCELLANEOUS) ×24 IMPLANT
CANISTER SUCT 1200ML W/VALVE (MISCELLANEOUS) ×6 IMPLANT
CATH IV 18X1 1/4 SAFELET (CATHETERS) ×6 IMPLANT
COAGULATOR SUCT 8FR VV (MISCELLANEOUS) ×6 IMPLANT
DRAPE HEAD BAR (DRAPES) ×6 IMPLANT
GLOVE PI ULTRA LF STRL 7.5 (GLOVE) ×12 IMPLANT
GLOVE PI ULTRA NON LATEX 7.5 (GLOVE) ×6
IV CATH 18X1 1/4 SAFELET (CATHETERS) ×4
IV NS 500ML (IV SOLUTION) ×2
IV NS 500ML BAXH (IV SOLUTION) ×4 IMPLANT
KIT ROOM TURNOVER OR (KITS) ×6 IMPLANT
NAVIGATION MASK REG  ST (MISCELLANEOUS) ×6 IMPLANT
NEEDLE ANESTHESIA  27G X 3.5 (NEEDLE) ×2
NEEDLE ANESTHESIA 27G X 3.5 (NEEDLE) ×4 IMPLANT
NEEDLE SPNL 25GX3.5 QUINCKE BL (NEEDLE) ×6 IMPLANT
NS IRRIG 500ML POUR BTL (IV SOLUTION) ×6 IMPLANT
PACK DRAPE NASAL/ENT (PACKS) ×6 IMPLANT
PACKING NASAL EPIS 4X2.4 XEROG (MISCELLANEOUS) ×12 IMPLANT
PAD GROUND ADULT SPLIT (MISCELLANEOUS) ×6 IMPLANT
PATTIES SURGICAL .5 X3 (DISPOSABLE) ×6 IMPLANT
SHAVER DIEGO BLD STD TYPE A (BLADE) ×6 IMPLANT
SOL ANTI-FOG 6CC FOG-OUT (MISCELLANEOUS) ×4 IMPLANT
SOL FOG-OUT ANTI-FOG 6CC (MISCELLANEOUS) ×2
SPLINT NASAL SEPTAL BLV .50 ST (MISCELLANEOUS) ×6 IMPLANT
SPONGE XRAY 4X4 16PLY STRL (MISCELLANEOUS) ×6 IMPLANT
STRAP BODY AND KNEE 60X3 (MISCELLANEOUS) ×6 IMPLANT
SUT CHROMIC 3-0 (SUTURE) ×4
SUT CHROMIC 3-0 KS 27XMFL CR (SUTURE) ×8
SUT ETHILON 3-0 KS 30 BLK (SUTURE) ×6 IMPLANT
SUT PLAIN GUT 4-0 (SUTURE) ×6 IMPLANT
SUTURE CHRMC 3-0 KS 27XMFL CR (SUTURE) ×8 IMPLANT
SYR 3ML LL SCALE MARK (SYRINGE) ×6 IMPLANT
TOWEL OR 17X26 4PK STRL BLUE (TOWEL DISPOSABLE) ×6 IMPLANT
TUBING DECLOG MULTIDEBRIDER (TUBING) ×5 IMPLANT
WATER STERILE IRR 250ML POUR (IV SOLUTION) ×6 IMPLANT

## 2016-11-02 NOTE — H&P (Signed)
H&P has been reviewed and pt reevaluated, and no changes necessary. To be downloaded later.  

## 2016-11-02 NOTE — Anesthesia Procedure Notes (Signed)
Procedure Name: Intubation Date/Time: 11/02/2016 10:49 AM Performed by: Jimmy PicketAMYOT, Cherylyn Sundby Pre-anesthesia Checklist: Patient identified, Emergency Drugs available, Suction available, Patient being monitored and Timeout performed Patient Re-evaluated:Patient Re-evaluated prior to inductionOxygen Delivery Method: Circle system utilized Preoxygenation: Pre-oxygenation with 100% oxygen Intubation Type: IV induction Ventilation: Mask ventilation without difficulty Laryngoscope Size: Miller and 3 Grade View: Grade I Tube type: Oral Rae Tube size: 7.5 mm Number of attempts: 1 Placement Confirmation: ETT inserted through vocal cords under direct vision,  positive ETCO2 and breath sounds checked- equal and bilateral Tube secured with: Tape Dental Injury: Teeth and Oropharynx as per pre-operative assessment

## 2016-11-02 NOTE — Anesthesia Preprocedure Evaluation (Addendum)
Anesthesia Evaluation  Patient identified by MRN, date of birth, ID band Patient awake    Reviewed: Allergy & Precautions, H&P , NPO status , Patient's Chart, lab work & pertinent test results  Airway Mallampati: II  TM Distance: >3 FB Neck ROM: full    Dental  (+) Partial Lower, Upper Dentures   Pulmonary COPD,  COPD inhaler, Current Smoker,    + rhonchi        Cardiovascular Normal cardiovascular exam     Neuro/Psych    GI/Hepatic   Endo/Other    Renal/GU      Musculoskeletal   Abdominal   Peds  Hematology   Anesthesia Other Findings Very mild rhonchi in RUL, otherwise clear  Reproductive/Obstetrics                            Anesthesia Physical Anesthesia Plan  ASA: III  Anesthesia Plan: General ETT   Post-op Pain Management:    Induction:   Airway Management Planned:   Additional Equipment:   Intra-op Plan:   Post-operative Plan:   Informed Consent: I have reviewed the patients History and Physical, chart, labs and discussed the procedure including the risks, benefits and alternatives for the proposed anesthesia with the patient or authorized representative who has indicated his/her understanding and acceptance.     Plan Discussed with:   Anesthesia Plan Comments:         Anesthesia Quick Evaluation

## 2016-11-02 NOTE — Op Note (Signed)
11/02/2016  12:43 PM  [id number]   Pre-Op Dx:  Deviated Nasal Septum, chronic bilateral ethmoid sinusitis, chronic bilateral maxillary sinusitis, chronic right frontal sinusitis, use of image guided system  Post-op Dx: Same  Proc: Nasal Septoplasty, right endoscopic total ethmoidectomy with frontal sinusotomy, left endoscopic total ethmoidectomy, bilateral endoscopic maxillary antrostomies with removal of contents, use of image guided system  Surg:  Harry Rodriguez  Anes:  GOT  EBL:  100 mL  Comp:  None  Findings: Septum markedly deviated to the left side. 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 The maxillary sinuses were filled with thick creamy clear mucus that was like rubber cement with bubbles in it. This was cleaned out on both sides completely.  Procedure: With the patient in a comfortable supine position,  general orotracheal anesthesia was induced without difficulty.     The patient received preoperative Afrin spray for topical decongestion and vasoconstriction.  Intravenous prophylactic antibiotics were administered. The image guided system was brought in and the CT scan was downloaded to the system. The template was applied the face and the template was registered. There was 0.8 mm of variance. The suction instruments were registered and the showed perfect alignment with the CT scan.  At an appropriate level, the patient was placed in a semi-sitting position.  Nasal vibrissae were trimmed.   1% Xylocaine with 1:100,000 epinephrine, 6 cc's, was infiltrated into the anterior floor of the nose, into the nasal spine region, into the membranous columella, and finally into the submucoperichondrial plane of the septum on both sides.  Several minutes were allowed for this to take effect.  Cottoniod pledgetts soaked in Afrin and 4% Xylocaine were placed into both nasal cavities and left while the patient was prepped and draped in the standard fashion.  The  materials were removed from the nose and observed to be intact and correct in number.  The nose was inspected with a headlight and the 0 scope with the findings as described above.  A left Killian incision was sharply executed and carried down to the quadrangular cartilage. The mucoperichondrium was elelvated along the quadrangular plate back to the bony-cartilaginous junction. The mucoperiostium was then elevated along the ethmoid plate and the vomer. The boney-catilaginous junction was then split with a freer elevator and the mucoperiosteum was elevated on the opposite side. The mucoperiosteum was then elevated along the maxillary crest as needed to expose the crooked bone of the crest.  Boney spurs of the vomer and maxillary crest were removed with Lenoria Chimeakahashi forceps.  The cartilaginous plate was trimmed along its posterior and inferior borders of about 2 mm of cartilage to free it up inferiorly. Some of the deviated ethmoid plate was then fractured and removed with Takahashi forceps to free up the posterior border of the quadrangular plate and allow it to swing back to the midline. The mucosal flaps were placed back into their anatomic position to allow visualization of the airways. The septum now sat in the midline with an improved airway.  A 3-0 Chromic suture on a Keith needle in used to anchor the inferior septum at the nasal spine with a through and through suture. The mucosal flaps are then sutured together using a through and through whip stitch of 4-0 [place] Gut with a mini-Keith needle. This was used to close the [place] incision as well.   The 0 scope was used to visualize the left nasal cavity. The image guided system was used to  evaluate the depth of dissection and anatomic landmarks. The middle turbinate was infractured to visualize the middle meatus better. A side biter was used for incising the uncinate process and trimming this. For opening of the maxillary antrum. The maxillary antrum  was widened using the [facility] microdebrider and once it was opened you could see this thick creamy clear mucus that was like rubber cement completely filling the maxillary sinus. This was all suctioned clear. There was no evidence of purulence. The natural ostium was included in the antrostomy to make sure that this was widely opened and could easily drain. 30 scope was used for visualizing the depth the sinus to make sure it was clear. The 0 scope was then used for visualizing the posterior middle ethmoid air cells. These were opened widely and the [facility] microdebrider was used for trimming of the tissues around the edges to make sure all sinuses were opened. Image guided system was used to evaluate each sinus. The 30 scope was then used to open up the anterior ethmoid air cells. The frontal sinus duct was medial and was opened as he had not had frontal sinus problems on this left side. The remaining sinuses were all opened and a cotton pledget was placed here temporarily while the right side was addressed.  0 scope was used to visualize the right nasal cavity and the middle turbinate was infractured. It had a bulge on its medial superior area that represented a conchal bullosa. This was opened and the lateral edge of the conchal bullosa was removed. The uncinate process was then incised using the side biter and it was totally removed. The maxillary antrum was widened again using the [facility] microdebrider and the natural ostium was included in this. The maxillary sinus was again filled with a thick glue like fluid that was clear to white with bubbles in it. This was completely cleaned out of the sinus. The 30 scope was used to visualize this area better. The 0 scope was then used again to open up the middle and posterior ethmoid air cells. The microdebrider was used to clean the air cells out and the rough edges smoothed out. The image guided system was used to evaluate the depth of dissection and make sure all  of the sinuses were opened. The 30 scope was used to visualize the anterior ethmoid air cells make sure these were opened widely. The frontal sinus duct was lateral and was opened wider. Frontal sinus duct was opened to get into the frontal sinus easily and create a large opening here so it could not block off again. The right side was reevaluated all the sinuses were completely opened and cleaned out. Cottonoid pledget was placed here temporarily.  The left side was revisualized with 0 and 30 scopes and sinuses were clear throughout. There is no significant bleeding. Xerogel was placed into the anterior ethmoid and frontal sinus duct and then a second piece placed between the middle turbinate and the lateral wall. The right side was then revisualized again and 0 and 30 scopes there is no further bleeding. Xerogel was placed again and the anterior ethmoid and frontal sinus duct and then a second piece between the middle turbinate and the lateral wall.  The airways were then visualized and showed open passageways on both sides that were significantly improved compared to before surgery. There was no signifcant bleeding. Nasal splints were applied to both sides of the septum using Xomed 0.41mm regular sized splints that were trimmed, and then  held in position with a 3-0 Nylon through and through suture.  The patient was turned back over to anesthesia, and awakened, extubated, and taken to the PACU in satisfactory condition.  Dispo:   PACU to home  Plan: Ice, elevation, narcotic analgesia, steroid taper, and prophylactic antibiotics for the duration of indwelling nasal foreign bodies.  We will reevaluate the patient in the office in 6 days and remove the septal splints.  Return to work in 10 days, strenuous activities in two weeks.   Harry Rodriguez 11/02/2016 12:43 PM

## 2016-11-02 NOTE — Anesthesia Postprocedure Evaluation (Signed)
Anesthesia Post Note  Patient: Delanna NoticeJohn Matin III  Procedure(s) Performed: Procedure(s) (LRB): IMAGE GUIDED SINUS SURGERY (N/A) MAXILLARY ANTROSTOMY with removal of tissue from sinus (Bilateral) ETHMOIDECTOMY left total (Left) right total ethmoidectomy with frontal sinus exploration (Right) SEPTOPLASTY (N/A)  Patient location during evaluation: PACU Anesthesia Type: General Level of consciousness: awake and alert and oriented Pain management: satisfactory to patient Vital Signs Assessment: post-procedure vital signs reviewed and stable Respiratory status: spontaneous breathing, nonlabored ventilation and respiratory function stable Cardiovascular status: blood pressure returned to baseline and stable Postop Assessment: Adequate PO intake and No signs of nausea or vomiting Anesthetic complications: no    Cherly BeachStella, Charisa Twitty J

## 2016-11-02 NOTE — Transfer of Care (Signed)
Immediate Anesthesia Transfer of Care Note  Patient: Harry NoticeJohn Reifsteck Rodriguez  Procedure(s) Performed: Procedure(s) with comments: IMAGE GUIDED SINUS SURGERY (N/A) - GAVE DISK TO CECE 4-26 KP MAXILLARY ANTROSTOMY with removal of tissue from sinus (Bilateral) ETHMOIDECTOMY left total (Left) right total ethmoidectomy with frontal sinus exploration (Right) SEPTOPLASTY (N/A)  Patient Location: PACU  Anesthesia Type: General ETT  Level of Consciousness: awake, alert  and patient cooperative  Airway and Oxygen Therapy: Patient Spontanous Breathing and Patient connected to supplemental oxygen  Post-op Assessment: Post-op Vital signs reviewed, Patient's Cardiovascular Status Stable, Respiratory Function Stable, Patent Airway and No signs of Nausea or vomiting  Post-op Vital Signs: Reviewed and stable  Complications: No apparent anesthesia complications

## 2016-11-03 ENCOUNTER — Encounter: Payer: Self-pay | Admitting: Otolaryngology

## 2016-11-06 LAB — SURGICAL PATHOLOGY

## 2017-08-03 DEATH — deceased
